# Patient Record
Sex: Female | Born: 2000 | Race: White | Hispanic: No | Marital: Single | Smoking: Current every day smoker
Health system: Southern US, Community
[De-identification: ages and names within clinical notes are randomized; demographics above are authoritative.]

---
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
## 2020-12-09 ENCOUNTER — Encounter (HOSPITAL_BASED_OUTPATIENT_CLINIC_OR_DEPARTMENT_OTHER): Payer: Self-pay

## 2020-12-09 ENCOUNTER — Other Ambulatory Visit: Payer: Self-pay

## 2020-12-09 ENCOUNTER — Emergency Department (HOSPITAL_BASED_OUTPATIENT_CLINIC_OR_DEPARTMENT_OTHER): Payer: Self-pay

## 2020-12-09 ENCOUNTER — Emergency Department (HOSPITAL_BASED_OUTPATIENT_CLINIC_OR_DEPARTMENT_OTHER)
Admission: EM | Admit: 2020-12-09 | Discharge: 2020-12-09 | Disposition: A | Discharge status: Home / Self Care | Payer: Self-pay | Attending: Emergency Medicine | Admitting: Emergency Medicine

## 2020-12-09 DIAGNOSIS — O26891 Other specified pregnancy related conditions, first trimester: Secondary | ICD-10-CM | POA: Insufficient documentation

## 2020-12-09 DIAGNOSIS — R102 Pelvic and perineal pain: Secondary | ICD-10-CM

## 2020-12-09 DIAGNOSIS — Z3A00 Weeks of gestation of pregnancy not specified: Secondary | ICD-10-CM | POA: Insufficient documentation

## 2020-12-09 DIAGNOSIS — O219 Vomiting of pregnancy, unspecified: Secondary | ICD-10-CM | POA: Insufficient documentation

## 2020-12-09 DIAGNOSIS — F17290 Nicotine dependence, other tobacco product, uncomplicated: Secondary | ICD-10-CM | POA: Insufficient documentation

## 2020-12-09 DIAGNOSIS — R1030 Lower abdominal pain, unspecified: Secondary | ICD-10-CM | POA: Insufficient documentation

## 2020-12-09 DIAGNOSIS — O99331 Smoking (tobacco) complicating pregnancy, first trimester: Secondary | ICD-10-CM | POA: Insufficient documentation

## 2020-12-09 LAB — COMPREHENSIVE METABOLIC PANEL
ALT: 16 U/L (ref 0–44)
AST: 19 U/L (ref 15–41)
Albumin: 4.2 g/dL (ref 3.5–5.0)
Alkaline Phosphatase: 47 U/L (ref 38–126)
Anion gap: 11 (ref 5–15)
BUN: 10 mg/dL (ref 6–20)
CO2: 21 mmol/L — ABNORMAL LOW (ref 22–32)
Calcium: 9 mg/dL (ref 8.9–10.3)
Chloride: 104 mmol/L (ref 98–111)
Creatinine, Ser: 0.64 mg/dL (ref 0.44–1.00)
GFR, Estimated: >60 mL/min (ref >60)
Glucose, Bld: 93 mg/dL (ref 70–99)
Potassium: 3.4 mmol/L — ABNORMAL LOW (ref 3.5–5.1)
Sodium: 136 mmol/L (ref 135–145)
Total Bilirubin: 0.5 mg/dL (ref 0.3–1.2)
Total Protein: 7.1 g/dL (ref 6.5–8.1)

## 2020-12-09 LAB — URINALYSIS, MICROSCOPIC (REFLEX)

## 2020-12-09 LAB — CBC WITH DIFFERENTIAL/PLATELET
Abs Immature Granulocytes: 0.05 10*3/uL (ref 0.00–0.07)
Basophils Absolute: 0 10*3/uL (ref 0.0–0.1)
Basophils Relative: 0 %
Eosinophils Absolute: 0.1 10*3/uL (ref 0.0–0.5)
Eosinophils Relative: 1 %
HCT: 39.3 % (ref 36.0–46.0)
Hemoglobin: 13.6 g/dL (ref 12.0–15.0)
Immature Granulocytes: 1 %
Lymphocytes Relative: 22 %
Lymphs Abs: 1.9 10*3/uL (ref 0.7–4.0)
MCH: 30.8 pg (ref 26.0–34.0)
MCHC: 34.6 g/dL (ref 30.0–36.0)
MCV: 88.9 fL (ref 80.0–100.0)
Monocytes Absolute: 0.6 10*3/uL (ref 0.1–1.0)
Monocytes Relative: 7 %
Neutro Abs: 6.2 10*3/uL (ref 1.7–7.7)
Neutrophils Relative %: 69 %
Platelets: 372 10*3/uL (ref 150–400)
RBC: 4.42 MIL/uL (ref 3.87–5.11)
RDW: 12.8 % (ref 11.5–15.5)
WBC: 8.9 10*3/uL (ref 4.0–10.5)
nRBC: 0 % (ref 0.0–0.2)

## 2020-12-09 LAB — URINALYSIS, ROUTINE W REFLEX MICROSCOPIC
Bilirubin Urine: NEGATIVE
Glucose, UA: NEGATIVE mg/dL
Ketones, ur: 15 mg/dL — AB
Nitrite: NEGATIVE
Protein, ur: NEGATIVE mg/dL
Specific Gravity, Urine: >1.03 — ABNORMAL HIGH (ref 1.005–1.030)
pH: 6 (ref 5.0–8.0)

## 2020-12-09 LAB — HCG, QUANTITATIVE, PREGNANCY: hCG, Beta Chain, Quant, S: 1055 m[IU]/mL — ABNORMAL HIGH (ref <5)

## 2020-12-09 LAB — LIPASE, BLOOD: Lipase: 26 U/L (ref 11–51)

## 2020-12-09 LAB — PREGNANCY, URINE: Preg Test, Ur: POSITIVE — AB

## 2020-12-09 MED ORDER — CEPHALEXIN 500 MG PO CAPS: 500.0000 mg | ORAL_CAPSULE | Freq: Two times a day (BID) | ORAL | 0 refills | Status: AC

## 2020-12-09 MED ORDER — SODIUM CHLORIDE 0.9 % IV BOLUS
1000.0000 mL | Freq: Once | INTRAVENOUS | Status: AC
  Administered 2020-12-09: 1000 mL via INTRAVENOUS

## 2020-12-09 NOTE — ED Notes (Signed)
To [place] via stretcher. Pt tolerated well.

## 2020-12-09 NOTE — ED Provider Notes (Signed)
[facility] EMERGENCY DEPARTMENT Provider Note   CSN: [id number] Arrival date & time: 12/09/20  1734     History Chief Complaint  Patient presents with  . Abdominal Pain    Judy Rivera is a 20 y.o. female.  HPI      Judy Rivera is a 20 y.o. female, with a history of G3P2, presenting to the ED with abdominal pain beginning yesterday.  She endorses lower abdominal pain, worse on the right, constant since onset, described as both dull and sharp, currently 5/10, radiating to the lower back. She also notes some lower pelvic pressure with urination.  She has not had similar symptoms in the past.  She did have nausea with some vomiting last night due to the intensity of pain.  She states her pain today is not as intense as it was yesterday. LMP Jan 26, but with irregular menstrual cycles.  Denies fever/chills, syncope, chest pain, shortness of breath, dysuria, difficulty urinating, hematuria, vaginal bleeding, abnormal vaginal discharge, diarrhea, hematochezia/melena, or any other complaints.  History reviewed. No pertinent past medical history.  There are no problems to display for this patient.   History reviewed. No pertinent surgical history.   OB History    Gravida  3   Para  0   Term      Preterm      AB  2   Living        SAB      IAB      Ectopic      Multiple      Live Births              No family history on file.  Social History   Tobacco Use  . Smoking status: Current Every Day Smoker    Types: E-cigarettes  . Smokeless tobacco: Never Used  Vaping Use  . Vaping Use: Every day  Substance Use Topics  . Alcohol use: Never  . Drug use: Yes    Types: Marijuana    Home Medications Prior to Admission medications   Medication Sig Start Date End Date Taking? Authorizing Provider  cephALEXin (KEFLEX) 500 MG capsule Take 1 capsule (500 mg total) by mouth 2 (two) times daily for 3 days. 12/09/20 12/12/20 Yes Jerrel Tiberio C,  PA-C    Allergies    Patient has no known allergies.  Review of Systems   Review of Systems  Constitutional: Negative for chills, diaphoresis and fever.  Respiratory: Negative for shortness of breath.   Cardiovascular: Negative for chest pain.  Gastrointestinal: Positive for abdominal pain, nausea and vomiting. Negative for blood in stool, constipation and diarrhea.  Genitourinary: Negative for difficulty urinating, dysuria, flank pain, hematuria, vaginal bleeding and vaginal discharge.  Neurological: Negative for dizziness, syncope and weakness.  All other systems reviewed and are negative.   Physical Exam Updated Vital Signs BP 109/67 (BP Location: Left Arm)   Pulse 84   Temp 98.7 F (37.1 C) (Oral)   Resp 18   Ht 5' (1.524 m)   Wt 67.6 kg   LMP 10/26/2020   SpO2 98%   BMI 29.10 kg/m   Physical Exam Vitals and nursing note reviewed.  Constitutional:      General: She is not in acute distress.    Appearance: She is well-developed. She is not diaphoretic.  HENT:     Head: Normocephalic and atraumatic.     Mouth/Throat:     Mouth: Mucous membranes are moist.     Pharynx:  Oropharynx is clear.  Eyes:     Conjunctiva/sclera: Conjunctivae normal.  Cardiovascular:     Rate and Rhythm: Normal rate and regular rhythm.     Pulses: Normal pulses.     Heart sounds: Normal heart sounds.  Pulmonary:     Effort: Pulmonary effort is normal. No respiratory distress.     Breath sounds: Normal breath sounds.  Abdominal:     Palpations: Abdomen is soft.     Tenderness: There is abdominal tenderness in the right lower quadrant and suprapubic area. There is no guarding.  Musculoskeletal:     Cervical back: Neck supple.  Skin:    General: Skin is warm and dry.  Neurological:     Mental Status: She is alert.  Psychiatric:        Mood and Affect: Mood and affect normal.        Speech: Speech normal.        Behavior: Behavior normal.     ED Results / Procedures / Treatments    Labs (all labs ordered are listed, but only abnormal results are displayed) Labs Reviewed  URINALYSIS, ROUTINE W REFLEX MICROSCOPIC - Abnormal; Notable for the following components:      Result Value   APPearance CLOUDY (*)    Specific Gravity, Urine >1.030 (*)    Hgb urine dipstick LARGE (*)    Ketones, ur 15 (*)    Leukocytes,Ua TRACE (*)    All other components within normal limits  PREGNANCY, URINE - Abnormal; Notable for the following components:   Preg Test, Ur POSITIVE (*)    All other components within normal limits  URINALYSIS, MICROSCOPIC (REFLEX) - Abnormal; Notable for the following components:   Bacteria, UA MANY (*)    All other components within normal limits  COMPREHENSIVE METABOLIC PANEL - Abnormal; Notable for the following components:   Potassium 3.4 (*)    CO2 21 (*)    All other components within normal limits  HCG, QUANTITATIVE, PREGNANCY - Abnormal; Notable for the following components:   hCG, Beta Chain, Quant, S 1,055 (*)    All other components within normal limits  URINE CULTURE  CBC WITH DIFFERENTIAL/PLATELET  LIPASE, BLOOD    EKG None  Radiology US OB LESS THAN 14 WEEKS WITH OB TRANSVAGINAL  Result Date: 12/09/2020 CLINICAL DATA:  Right lower quadrant pain. EXAM: OBSTETRIC <14 WK [place] AND TRANSVAGINAL OB US TECHNIQUE: Both transabdominal and transvaginal ultrasound examinations were performed for complete evaluation of the gestation as well as the maternal uterus, adnexal regions, and pelvic cul-de-sac. Transvaginal technique was performed to assess early pregnancy. COMPARISON:  None. FINDINGS: Intrauterine gestational sac: None Yolk sac:  Not Visualized. Embryo:  Not Visualized. Cardiac Activity: Not Visualized. Heart Rate: N/A  bpm Subchorionic hemorrhage:  None visualized. Maternal uterus/adnexae: The uterus measures 8.9 cm x 4.3 cm x 6.3 cm and is normal in appearance. The right ovary measures 3.6 cm x 2.0 cm x 2.8 cm. A 1.5 cm x 1.7 cm x 1.5 cm  area of heterogeneous hypoechogenicity is seen within the right ovary. The left ovary 3.0 cm x 2.2 cm x 1.9 cm and is normal in appearance. A small amount of free fluid is seen within the right adnexa and posterior cul-de-sac. IMPRESSION: 1. No evidence of an intrauterine pregnancy. 2. Hypoechoic area within the right ovary which may represent a corpus luteum cyst. Correlation with follow-up pelvic ultrasound and serial beta HCG levels is recommended. Electronically Signed   By: Aram Candela  M.D.   On: 12/09/2020 20:35    Procedures Procedures   Medications Ordered in ED Medications  sodium chloride 0.9 % bolus 1,000 mL (0 mLs Intravenous Stopped 12/09/20 2149)    ED Course  I have reviewed the triage vital signs and the nursing notes.  Pertinent labs & imaging results that were available during my care of the patient were reviewed by me and considered in my medical decision making (see chart for details).    MDM Rules/Calculators/A&P                          Patient presents with abdominal pain beginning yesterday.  Pain has improved since onset. Patient is nontoxic appearing, afebrile, not tachycardic, not tachypneic, not hypotensive, maintains excellent SPO2 on room air, and is in no apparent distress.   I have reviewed the patient's chart to obtain more information.   I reviewed and interpreted the patient's labs and radiological studies. No IUP noted on ultrasound, however, hCG just above 1000. Improved abdominal exam on repeat here in ED. Due to the patient's overall well appearance and spontaneous improvement in her pain, we proposed a plan that included reassessment in 48 hours at the MAU or with return to the ED. Antibiotic initiated due to abnormalities noted on patient's UA, especially in the setting of pregnancy. Strict return precautions discussed.  Patient voices understanding of these instructions, accepts the plan, and is comfortable with discharge.  Findings and  plan of care discussed with attending physician, Susy Frizzle, MD.     Final Clinical Impression(s) / ED Diagnoses Final diagnoses:  Lower abdominal pain    Rx / DC Orders ED Discharge Orders         Ordered    cephALEXin (KEFLEX) 500 MG capsule  2 times daily        12/09/20 2139           Concepcion Living 12/10/20 0029    Pollyann Savoy, MD 12/10/20 [phone]

## 2020-12-09 NOTE — Discharge Instructions (Addendum)
  Your urine appears to be consistent with UTI. Please take all of your antibiotics until finished!   You may develop abdominal discomfort or diarrhea from the antibiotic.  You may help offset this with probiotics which you can buy or get in yogurt. Do not eat or take the probiotics until 2 hours after your antibiotic.   Proceed to the [facility] at [facility] in 48 hours (on March 13) for repeat hCG pregnancy hormone level and repeat abdominal exam.  Return to the emergency department sooner for persistent vomiting, worsening pain, significant vaginal bleeding, passing out, or any other major concerns.

## 2020-12-09 NOTE — ED Triage Notes (Addendum)
Pt c/o lower abd pain n/v started last night-was advised by UC +preg and advised to come to ED-NAD-steady gait

## 2020-12-11 ENCOUNTER — Inpatient Hospital Stay (HOSPITAL_COMMUNITY)
Admission: AD | Admit: 2020-12-11 | Discharge: 2020-12-11 | Disposition: A | Discharge status: Home / Self Care | Payer: Self-pay | Attending: Family Medicine | Admitting: Family Medicine

## 2020-12-11 ENCOUNTER — Encounter (HOSPITAL_COMMUNITY): Payer: Self-pay | Admitting: Family Medicine

## 2020-12-11 ENCOUNTER — Other Ambulatory Visit: Payer: Self-pay

## 2020-12-11 DIAGNOSIS — O3680X0 Pregnancy with inconclusive fetal viability, not applicable or unspecified: Secondary | ICD-10-CM

## 2020-12-11 LAB — URINE CULTURE

## 2020-12-11 LAB — COMPREHENSIVE METABOLIC PANEL
ALT: 16 U/L (ref 0–44)
AST: 17 U/L (ref 15–41)
Albumin: 4 g/dL (ref 3.5–5.0)
Alkaline Phosphatase: 48 U/L (ref 38–126)
Anion gap: 9 (ref 5–15)
BUN: 11 mg/dL (ref 6–20)
CO2: 23 mmol/L (ref 22–32)
Calcium: 9.2 mg/dL (ref 8.9–10.3)
Chloride: 104 mmol/L (ref 98–111)
Creatinine, Ser: 0.64 mg/dL (ref 0.44–1.00)
GFR, Estimated: >60 mL/min (ref >60)
Glucose, Bld: 101 mg/dL — ABNORMAL HIGH (ref 70–99)
Potassium: 4.1 mmol/L (ref 3.5–5.1)
Sodium: 136 mmol/L (ref 135–145)
Total Bilirubin: 0.7 mg/dL (ref 0.3–1.2)
Total Protein: 6.9 g/dL (ref 6.5–8.1)

## 2020-12-11 LAB — ABO/RH: ABO/RH(D): A POS

## 2020-12-11 LAB — HCG, QUANTITATIVE, PREGNANCY: hCG, Beta Chain, Quant, S: 556 m[IU]/mL — ABNORMAL HIGH (ref <5)

## 2020-12-11 NOTE — MAU Provider Note (Addendum)
Event Date/Time  First Provider Initiated Contact with Patient 12/11/20 1459     S Ms. Judy Rivera is a 20 y.o. G3P0020 patient who presents to MAU today for Stat Quant hCG. Patient was evaluated at [facility] ED two days ago for abdominal pain. She states at discharge from that encounter she was told to follow up in MAU for repeat Quant today. She states her abdominal pain resolved yesterday. She continues to experience vaginal spotting. She denies heavy vaginal bleeding, abdominal tenderness, fever or recent illness.  O BP 107/61 (BP Location: Right Arm)   Pulse (!) 105   Temp 98.1 F (36.7 C) (Oral)   Resp 15   LMP 10/26/2020   SpO2 98%    Physical Exam Vitals and nursing note reviewed.  Cardiovascular:     Rate and Rhythm: Normal rate.     Pulses: Normal pulses.  Pulmonary:     Effort: Pulmonary effort is normal.  Abdominal:     General: Abdomen is flat.     Tenderness: There is no abdominal tenderness.  Skin:    Capillary Refill: Capillary refill takes less than 2 seconds.  Neurological:     Mental Status: She is alert and oriented to person, place, and time.  Psychiatric:        Mood and Affect: Mood normal.        Behavior: Behavior normal.        Thought Content: Thought content normal.        Judgment: Judgment normal.    A Medical screening exam complete  No IUP visualized on ultrasound performed 12/09/2020 Quant hCG 1055 on 12/09/2020 Quant hCG 556 today, discussed concern for nonviable pregnancy/miscarriage in progress Blood type A POS  P Discharge from MAU in stable condition with bleeding, ectopic precautions Warning signs for worsening condition that would warrant emergency follow-up discussed Patient may return to MAU as needed for pregnancy  F/U: Patient scheduled for repeat quant hCG at [facility] Tuesday afternoon  Clayton Bibles, [place] 12/11/2020 5:14 PM

## 2020-12-11 NOTE — Discharge Instructions (Signed)
Human Chorionic Gonadotropin Test Why am I having this test? A human chorionic gonadotropin (hCG) test is done to determine whether you are pregnant. It can also be used:  To diagnose an abnormal pregnancy.  To determine whether you have had a miscarriage or are at risk of one. What is being tested? This test checks the level of the human chorionic gonadotropin (hCG) hormone in the blood. This hormone is produced during pregnancy by the cells that form the placenta. The placenta is the organ that grows inside your uterus to nourish a developing baby. When you are pregnant, hCG can be detected in your blood or urine 7 to 8 days before your missed period. The amount of hCG continues to increase for the first 8-10 weeks of pregnancy. The presence of hCG in your blood can be measured with different types of tests. You may have:  A urine test. ? A urine test only shows whether there is hCG in your urine. It does not measure how much.  A qualitative blood test. ? This blood test only shows whether there is hCG in your blood. It does not measure how much.  A quantitative blood test. ? This type of blood test measures the amount of hCG in your blood. ? You may have this test to:  Diagnose an abnormal pregnancy.  Check whether you have had a miscarriage.  Determine whether you are at risk of a miscarriage.  Determine if treatment of an ectopic pregnancy is successful. What kind of sample is taken? Two kinds of samples may be collected to test for the hCG hormone.  Blood. It is usually collected by inserting a needle into a blood vessel.  Urine. It is usually collected by urinating into a germ-free (sterile) specimen cup.      How do I prepare for this test? No preparation is needed for a blood test.  Some preparation is needed for a urine test:  For best results, collect the sample the first time you urinate in the morning. That is when the concentration of hCG is highest.  Do not  drink too much fluid. Drink as you normally would, or as directed by your health care provider. Tell a health care provider about:  All medicines you are taking, including vitamins, herbs, eye drops, creams, and over-the-counter medicines.  Any blood in your urine. This may interfere with the result. How are the results reported? Depending on the type of test that you have, your test results may be reported as values. Your health care provider will compare your results to normal ranges that were established after testing a large group of people (reference ranges). Reference ranges may vary among labs and hospitals. For this test, common reference ranges that show absence of pregnancy are:  Quantitative hCG blood levels: less than 5 IU/L. Other results will be reported as either positive or negative. For this test, normal results (meaning the absence of pregnancy) are:  Negative for hCG in the urine test.  Negative for hCG in the qualitative blood test. What do the results mean? Urine and qualitative blood test  A negative result could mean: ? That you are not pregnant. ? That the test was done too early in your pregnancy to detect hCG in your blood or urine. If you still have other signs of pregnancy, the test will be repeated.  A positive result means: ? That you are most likely pregnant. Your health care provider may confirm your pregnancy with an ultrasound of   your uterus, if needed. Quantitative blood test Results of the quantitative hCG blood test will be reported as values. These values will be interpreted by your health care provider along with your medical history and symptoms you are experiencing. Results outside of expected ranges could mean that:  You are pregnant with twins.  You have abnormal growths in your uterus.  You have an ectopic pregnancy.  You may be experiencing a miscarriage. Talk with your health care provider about what your results mean. Questions to ask  your health care provider Ask your health care provider, or the department that is doing the test:  When will my results be ready?  How will I get my results?  What are my treatment options?  What other tests do I need?  What are my next steps? Summary  A human chorionic gonadotropin (hCG) test is done to determine whether you are pregnant.  When you are pregnant, hCG can be detected in your blood or urine 7 to 8 days before your missed period. HCG levels continue to go up for the first 8-10 weeks of pregnancy.  Your hCG level can be measured with different types of tests. You may have a urine test, a qualitative blood test, or a quantitative blood test.  Talk with your health care provider about what your test results mean. This information is not intended to replace advice given to you by your health care provider. Make sure you discuss any questions you have with your health care provider. Document Revised: 06/20/2020 Document Reviewed: 06/20/2020 Elsevier Patient Education  2021 Elsevier Inc.  

## 2020-12-11 NOTE — MAU Note (Signed)
Pt reports to mau after being seen at [facility] 2 days ago for abd. Pain.  Pt states she was told to follow up in MAU today.  Pt denies any active bleeding or abd pain today.

## 2020-12-13 ENCOUNTER — Telehealth: Payer: Self-pay

## 2020-12-13 ENCOUNTER — Ambulatory Visit: Payer: Self-pay

## 2020-12-13 NOTE — Telephone Encounter (Signed)
Called pt to follow up on missed appt today for repeat beta HCG. Requested pt return call; callback number given. Will attempt to call patient a second time.

## 2020-12-14 NOTE — Telephone Encounter (Signed)
Attempted to call patient. She did not answer. LM for patient to call the office at [phone] to call and reschedule appointment for Beta Hcg.

## 2021-08-10 ENCOUNTER — Telehealth: Payer: Self-pay | Admitting: Physician Assistant

## 2021-08-10 DIAGNOSIS — K625 Hemorrhage of anus and rectum: Secondary | ICD-10-CM

## 2021-08-10 DIAGNOSIS — R1010 Upper abdominal pain, unspecified: Secondary | ICD-10-CM

## 2021-08-10 NOTE — Patient Instructions (Signed)
  [name], thank you for joining Margaretann Loveless, PA-C for today's virtual visit.  While this provider is not your primary care provider (PCP), if your PCP is located in our provider database this encounter information will be shared with them immediately following your visit.  Consent: (Patient) Judy Rivera provided verbal consent for this virtual visit at the beginning of the encounter.  Current Medications: No current outpatient medications on file.   Medications ordered in this encounter:  No orders of the defined types were placed in this encounter.    *If you need refills on other medications prior to your next appointment, please contact your pharmacy*  Follow-Up: Call back or seek an in-person evaluation if the symptoms worsen or if the condition fails to improve as anticipated.  Other Instructions Based on what you shared with me, I feel your condition warrants further evaluation and I recommend that you be seen in a face to face visit.  If you are having a true medical emergency please call 911.      For an urgent face to face visit, [facility] has six urgent care centers for your convenience:     [facility] at [facility] Directions [phone] [address] [place], [place] [address]    [facility]) Get Driving Directions [phone] [address] [place], [place] [address]  [facility]) Get Driving Directions [phone] [address] [place],  [place]  [address]  [facility] at [facility] Get Driving Directions [phone] [address] [place], [place] [address]   [facility] at [facility] Get Driving Directions  [phone] [address] [place], [place] [address]   [facility] at [facility]  Directions [phone] [address], [address] [place], [place] [address]   If you have been instructed to have an in-person evaluation today at a local Urgent Care facility, please use the link below. It will take you to a list of all of our available [facility], including address, phone number and hours of operation. Please do not delay care.  [facility]  If you or a family member do not have a primary care provider, use the link below to schedule a visit and establish care. When you choose a [facility] primary care physician or advanced practice provider, you gain a long-term partner in health. Find a Primary Care Provider  Learn more about [facility]'s in-office and virtual care options: [place] - Get Care Now

## 2021-08-10 NOTE — Progress Notes (Signed)
Virtual Visit Consent   Judy Rivera, you are scheduled for a virtual visit with a [facility] provider today.     Just as with appointments in the office, your consent must be obtained to participate.  Your consent will be active for this visit and any virtual visit you may have with one of our providers in the next 365 days.     If you have a MyChart account, a copy of this consent can be sent to you electronically.  All virtual visits are billed to your insurance company just like a traditional visit in the office.    As this is a virtual visit, video technology does not allow for your provider to perform a traditional examination.  This may limit your provider's ability to fully assess your condition.  If your provider identifies any concerns that need to be evaluated in person or the need to arrange testing (such as labs, EKG, etc.), we will make arrangements to do so.     Although advances in technology are sophisticated, we cannot ensure that it will always work on either your end or our end.  If the connection with a video visit is poor, the visit may have to be switched to a telephone visit.  With either a video or telephone visit, we are not always able to ensure that we have a secure connection.     I need to obtain your verbal consent now.   Are you willing to proceed with your visit today?    Judy Rivera has provided verbal consent on 08/10/2021 for a virtual visit (video or telephone).   Margaretann Loveless, PA-C   Date: 08/10/2021 11:18 AM   Virtual Visit via Video Note   I, Margaretann Loveless, connected with  Judy Rivera  ([id number], [birth year]) on 08/10/21 at 11:15 AM EST by a video-enabled telemedicine application and verified that I am speaking with the correct person using two identifiers.  Location: Patient: Virtual Visit Location Patient: Home Provider: Virtual Visit Location Provider: Home Office   I discussed the limitations of evaluation and  management by telemedicine and the availability of in person appointments. The patient expressed understanding and agreed to proceed.    History of Present Illness: Judy Rivera is a 20 y.o. who identifies as a female who was assigned female at birth, and is being seen today for rectal bleeding and stomach pain. Bleeding has been ongoing for 3 days. There is no pain. Bleeding is occurring with or without BM. With BM bleeding increases. Having sharp stabbing pains in the upper stomach and in the RUQ. Reports digestive issues for a while. Gets bloated after eating.   Problems: There are no problems to display for this patient.   Allergies: No Known Allergies Medications: No current outpatient medications on file.  Observations/Objective: Patient is well-developed, well-nourished in no acute distress.  Resting comfortably at home.  Head is normocephalic, atraumatic.  No labored breathing.  Speech is clear and coherent with logical content.  Patient is alert and oriented at baseline.    Assessment and Plan: 1. Rectal bleeding  2. Pain of upper abdomen  - Due to abdominal pain with rectal bleeding patient was advised to seek further care at local UC for further evaluation  Follow Up Instructions: I discussed the assessment and treatment plan with the patient. The patient was provided an opportunity to ask questions and all were answered. The patient agreed with the plan and demonstrated an understanding of the instructions.  A copy of instructions were sent to the patient via MyChart unless otherwise noted below.    The patient was advised to call back or seek an in-person evaluation if the symptoms worsen or if the condition fails to improve as anticipated.  Time:  I spent 10 minutes with the patient via telehealth technology discussing the above problems/concerns.    Margaretann Loveless, PA-C

## 2021-08-11 ENCOUNTER — Encounter (HOSPITAL_COMMUNITY): Payer: Self-pay | Admitting: Emergency Medicine

## 2021-08-11 ENCOUNTER — Emergency Department (HOSPITAL_COMMUNITY)
Admission: EM | Admit: 2021-08-11 | Discharge: 2021-08-11 | Disposition: A | Discharge status: Home / Self Care | Payer: Medicaid Other | Attending: Emergency Medicine | Admitting: Emergency Medicine

## 2021-08-11 ENCOUNTER — Other Ambulatory Visit: Payer: Self-pay

## 2021-08-11 DIAGNOSIS — R112 Nausea with vomiting, unspecified: Secondary | ICD-10-CM | POA: Insufficient documentation

## 2021-08-11 DIAGNOSIS — J029 Acute pharyngitis, unspecified: Secondary | ICD-10-CM | POA: Insufficient documentation

## 2021-08-11 DIAGNOSIS — R1010 Upper abdominal pain, unspecified: Secondary | ICD-10-CM | POA: Insufficient documentation

## 2021-08-11 DIAGNOSIS — K625 Hemorrhage of anus and rectum: Secondary | ICD-10-CM | POA: Insufficient documentation

## 2021-08-11 DIAGNOSIS — F17290 Nicotine dependence, other tobacco product, uncomplicated: Secondary | ICD-10-CM | POA: Insufficient documentation

## 2021-08-11 LAB — BASIC METABOLIC PANEL
Anion gap: 7 (ref 5–15)
BUN: 11 mg/dL (ref 6–20)
CO2: 26 mmol/L (ref 22–32)
Calcium: 9.3 mg/dL (ref 8.9–10.3)
Chloride: 105 mmol/L (ref 98–111)
Creatinine, Ser: 0.85 mg/dL (ref 0.44–1.00)
GFR, Estimated: >60 mL/min (ref >60)
Glucose, Bld: 102 mg/dL — ABNORMAL HIGH (ref 70–99)
Potassium: 4.2 mmol/L (ref 3.5–5.1)
Sodium: 138 mmol/L (ref 135–145)

## 2021-08-11 LAB — CBC WITH DIFFERENTIAL/PLATELET
Abs Immature Granulocytes: 0.02 10*3/uL (ref 0.00–0.07)
Basophils Absolute: 0 10*3/uL (ref 0.0–0.1)
Basophils Relative: 1 %
Eosinophils Absolute: 0.2 10*3/uL (ref 0.0–0.5)
Eosinophils Relative: 3 %
HCT: 42.5 % (ref 36.0–46.0)
Hemoglobin: 14.6 g/dL (ref 12.0–15.0)
Immature Granulocytes: 0 %
Lymphocytes Relative: 45 %
Lymphs Abs: 2.5 10*3/uL (ref 0.7–4.0)
MCH: 30.4 pg (ref 26.0–34.0)
MCHC: 34.4 g/dL (ref 30.0–36.0)
MCV: 88.5 fL (ref 80.0–100.0)
Monocytes Absolute: 0.4 10*3/uL (ref 0.1–1.0)
Monocytes Relative: 7 %
Neutro Abs: 2.4 10*3/uL (ref 1.7–7.7)
Neutrophils Relative %: 44 %
Platelets: 335 10*3/uL (ref 150–400)
RBC: 4.8 MIL/uL (ref 3.87–5.11)
RDW: 12.5 % (ref 11.5–15.5)
WBC: 5.6 10*3/uL (ref 4.0–10.5)
nRBC: 0 % (ref 0.0–0.2)

## 2021-08-11 LAB — I-STAT BETA HCG BLOOD, ED (MC, WL, AP ONLY): I-stat hCG, quantitative: <5 m[IU]/mL (ref <5)

## 2021-08-11 NOTE — ED Provider Notes (Signed)
[facility]-EMERGENCY DEPT Provider Note   CSN: [id number] Arrival date & time: 08/11/21  1914     History Chief Complaint  Patient presents with   Rectal Bleeding    Judy Rivera is a 20 y.o. female.  She has not Stefanon past medical history.  She is complaining of on and off blood in her stool for the last 4 days.  She said her stools formed and brown but has some red blood that comes off it into the bowl.  Generally painless although has noticed a little bit of upper abdominal cramping and left-sided cramping.  She denies any vaginal bleeding or urinary bleeding.  She was nauseous and vomited once although she thinks it was due to her recent sore throat.  No fevers or chills.  Does not take any medications.  Denies any rectal intercourse  The history is provided by the patient.  Rectal Bleeding Quality:  Bright red Amount:  Scant Duration:  4 days Timing:  Intermittent Chronicity:  New Context: defecation   Relieved by:  None tried Worsened by:  Defecation Ineffective treatments:  None tried Associated symptoms: abdominal pain, recent illness and vomiting   Associated symptoms: no dizziness, no epistaxis, no fever and no hematemesis   Risk factors: no anticoagulant use and no NSAID use       History reviewed. No pertinent past medical history.  There are no problems to display for this patient.   History reviewed. No pertinent surgical history.   OB History     Gravida  3   Para  0   Term      Preterm      AB  2   Living         SAB      IAB      Ectopic      Multiple      Live Births              No family history on file.  Social History   Tobacco Use   Smoking status: Every Day    Types: E-cigarettes   Smokeless tobacco: Never  Vaping Use   Vaping Use: Every day  Substance Use Topics   Alcohol use: Never   Drug use: Yes    Types: Marijuana    Home Medications Prior to Admission medications   Not  on File    Allergies    Patient has no known allergies.  Review of Systems   Review of Systems  Constitutional:  Negative for fever.  HENT:  Negative for nosebleeds and sore throat.   Eyes:  Negative for visual disturbance.  Respiratory:  Negative for shortness of breath.   Cardiovascular:  Negative for chest pain.  Gastrointestinal:  Positive for abdominal pain, blood in stool, hematochezia and vomiting. Negative for hematemesis.  Genitourinary:  Negative for dysuria.  Musculoskeletal:  Negative for back pain.  Skin:  Negative for rash.  Neurological:  Negative for dizziness.   Physical Exam Updated Vital Signs BP (!) 127/95 (BP Location: Right Arm)   Pulse 88   Temp 98.6 F (37 C) (Oral)   Resp 18   Ht 5' (1.524 m)   Wt 75.8 kg   LMP 07/19/2021 (Approximate)   SpO2 99%   Breastfeeding Unknown   BMI 32.61 kg/m   Physical Exam Vitals and nursing note reviewed.  Constitutional:      General: She is not in acute distress.    Appearance: Normal appearance. She  is well-developed.  HENT:     Head: Normocephalic and atraumatic.  Eyes:     Conjunctiva/sclera: Conjunctivae normal.  Cardiovascular:     Rate and Rhythm: Normal rate and regular rhythm.     Heart sounds: No murmur heard. Pulmonary:     Effort: Pulmonary effort is normal. No respiratory distress.     Breath sounds: Normal breath sounds.  Abdominal:     Palpations: Abdomen is soft.     Tenderness: There is no abdominal tenderness. There is no guarding or rebound.  Musculoskeletal:        General: No deformity or signs of injury. Normal range of motion.     Cervical back: Neck supple.  Skin:    General: Skin is warm and dry.  Neurological:     General: No focal deficit present.     Mental Status: She is alert.    ED Results / Procedures / Treatments   Labs (all labs ordered are listed, but only abnormal results are displayed) Labs Reviewed  BASIC METABOLIC PANEL - Abnormal; Notable for the following  components:      Result Value   Glucose, Bld 102 (*)    All other components within normal limits  CBC WITH DIFFERENTIAL/PLATELET  I-STAT BETA HCG BLOOD, ED ([facility], [facility], [facility] ONLY)    EKG None  Radiology No results found.  Procedures Procedures   Medications Ordered in ED Medications - No data to display  ED Course  I have reviewed the triage vital signs and the nursing notes.  Pertinent labs & imaging results that were available during my care of the patient were reviewed by me and considered in my medical decision making (see chart for details).  Clinical Course as of 08/11/21 5784  [facility] Aug 11, 2021  6962 Rectal exam done with nurse Norberta Keens as chaperone.  No external lesions.  Normal tone no masses.  No stool in vault. [MB]    Clinical Course User Index [MB] Terrilee Files, MD   MDM Rules/Calculators/A&P                          This patient complains of rectal bleeding with defecation; this involves an extensive number of treatment Options and is a complaint that carries with it a high risk of complications and Morbidity. The differential includes hemorrhoids, AVMs, diverticula, anal fissure  I ordered, reviewed and interpreted labs, which included CBC with normal white count normal hemoglobin, chemistries normal with normal BUN and creatinine, pregnancy negative Previous records obtained and reviewed in epic no recent visits  After the interventions stated above, I reevaluated the patient and found patient to be hemodynamically stable and asymptomatic.  Reviewed natural progression of her symptoms and return instructions discussed.   Final Clinical Impression(s) / ED Diagnoses Final diagnoses:  Rectal bleeding    Rx / DC Orders ED Discharge Orders     None        Terrilee Files, MD 08/11/21 [phone]

## 2021-08-11 NOTE — Discharge Instructions (Signed)
You were seen in the emergency department for evaluation of rectal bleeding.  You had blood work done that did not show any significant abnormalities.  Your rectal exam did not show any masses or obvious source of bleeding.  This is possibly hemorrhoids and usually will settle down over time.  Return to emergency department if any high fevers or worsening symptoms.

## 2021-08-11 NOTE — ED Triage Notes (Signed)
Patient complains of BRB is stool the past 4 days, states it is like a light/medium period, pt could urinate and there is blood. Reports a sharp pain on her R or l side, it changes, denies current pain.

## 2022-11-06 IMAGING — US US OB < 14 WEEKS - US OB TV
1 series · 13 of 28 positions shown · non-contrast
Comparison: None.

CLINICAL DATA: Right lower quadrant pain.

EXAM:
OBSTETRIC <14 WK US AND TRANSVAGINAL OB US
TECHNIQUE: Both transabdominal and transvaginal ultrasound examinations were
performed for complete evaluation of the gestation as well as the
maternal uterus, adnexal regions, and pelvic cul-de-sac.
Transvaginal technique was performed to assess early pregnancy.

[Series 1: us ob < 14 weeks - us ob tv · 13 of 61 slices shown]
[im 3/61]
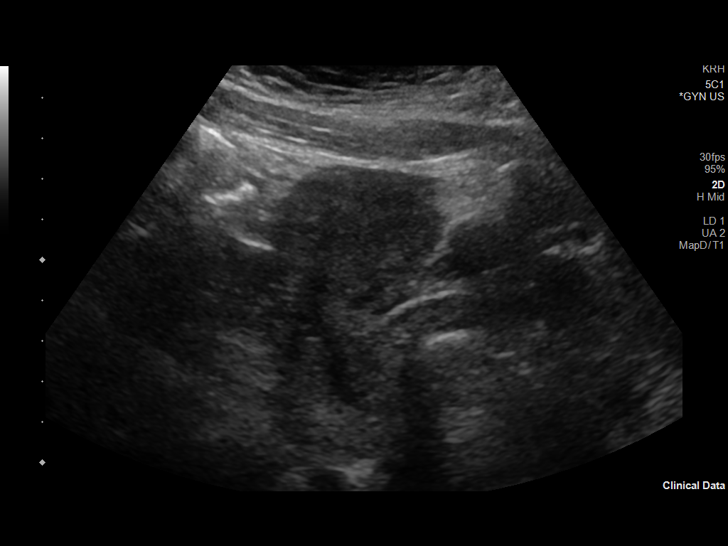
[im 7/61]
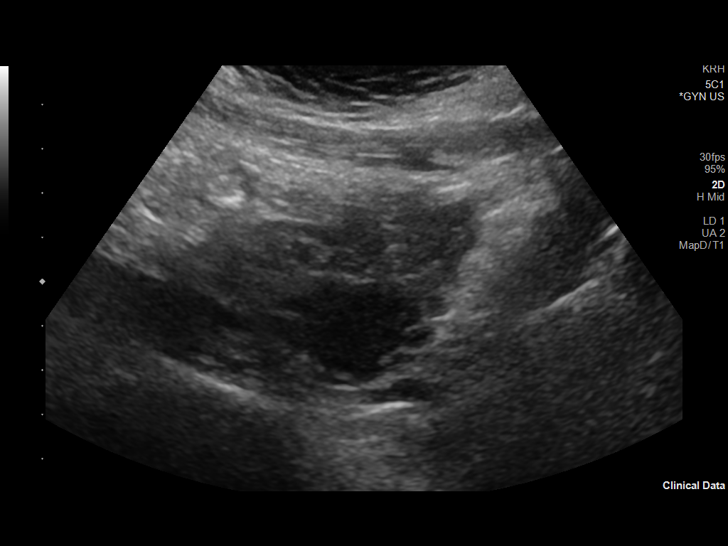
[im 12/61]
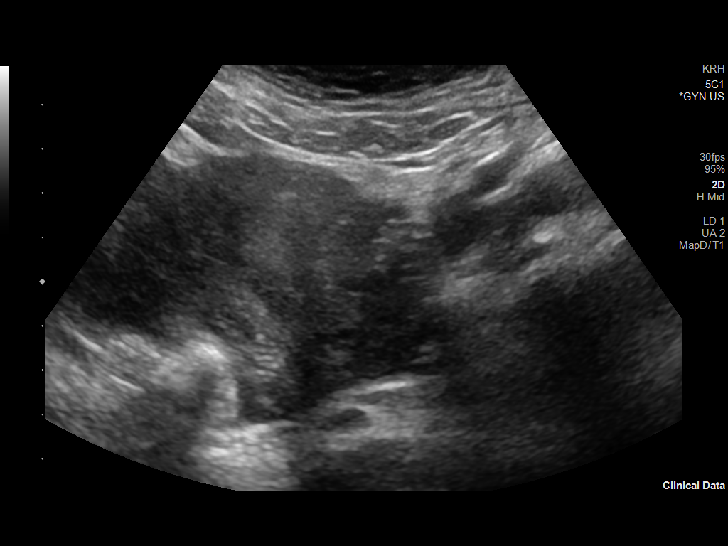
[im 16/61]
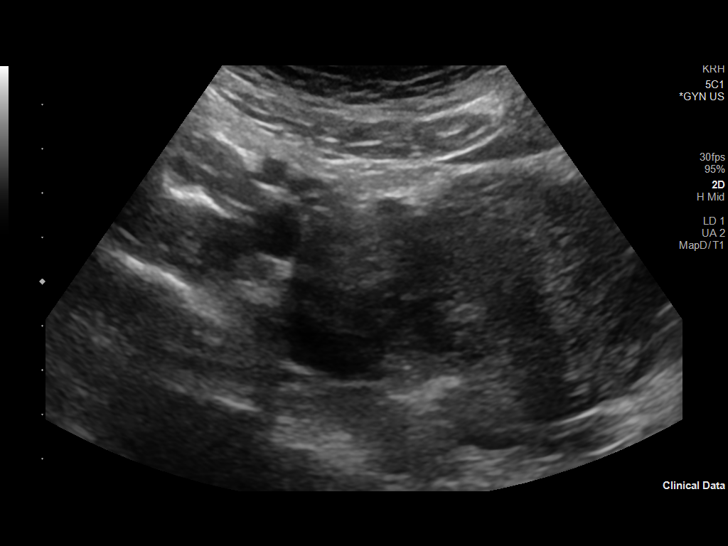
[im 21/61]
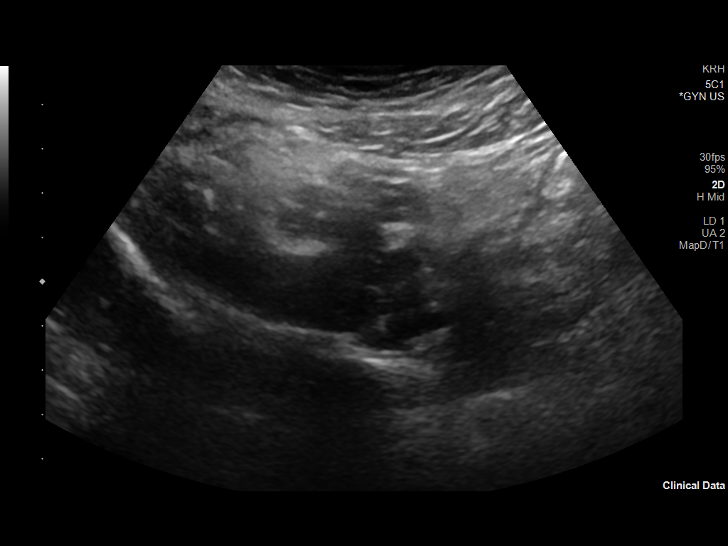
[im 25/61]
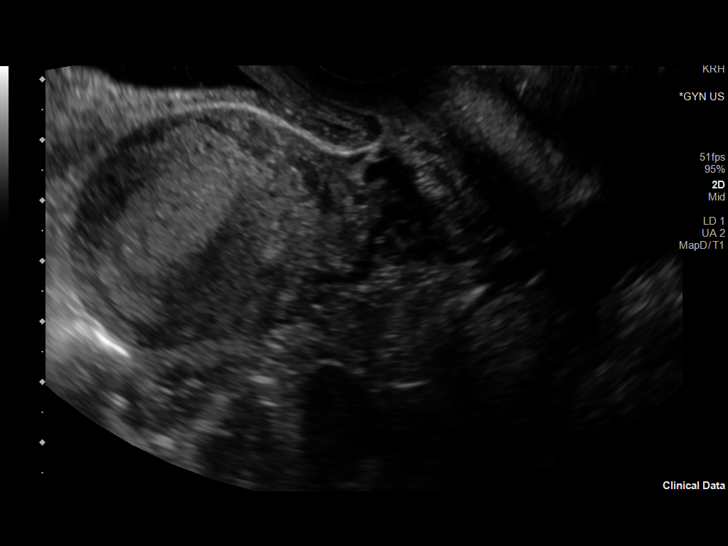
[im 32/61]
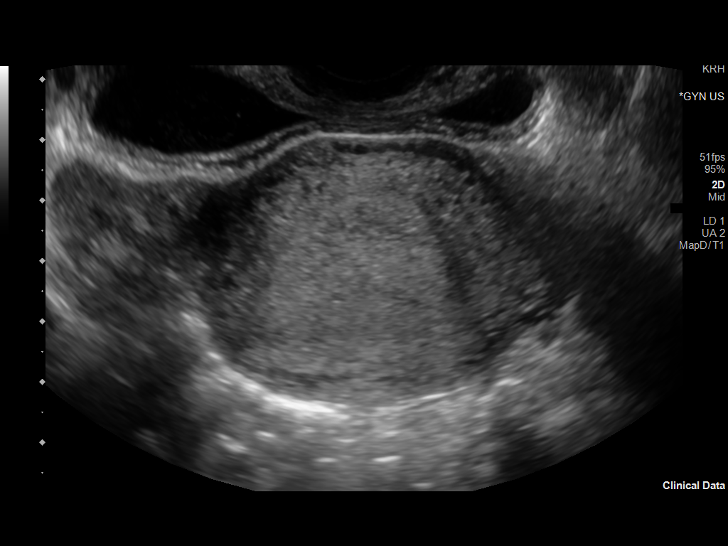
[im 36/61]
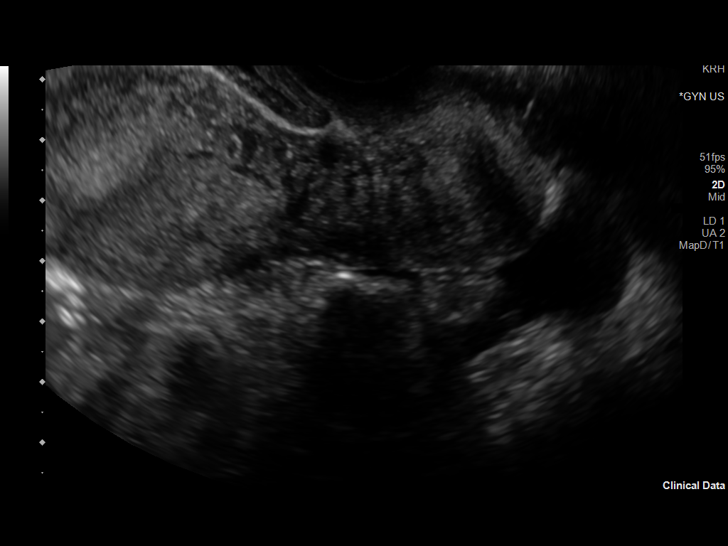
[im 41/61]
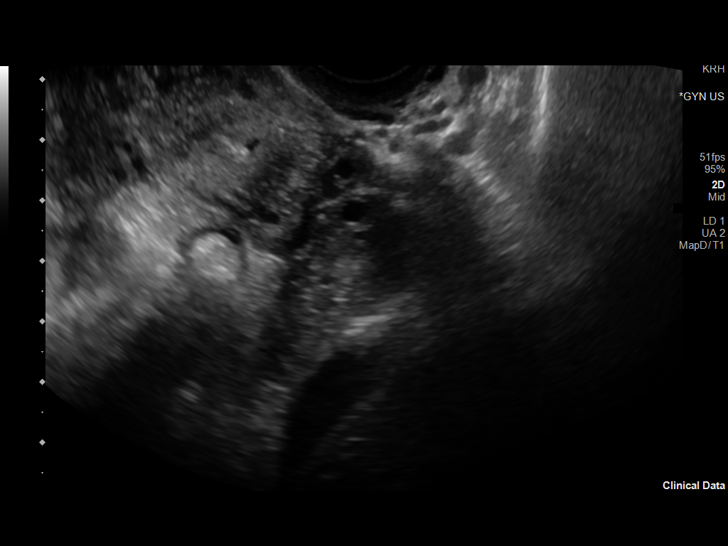
[im 45/61]
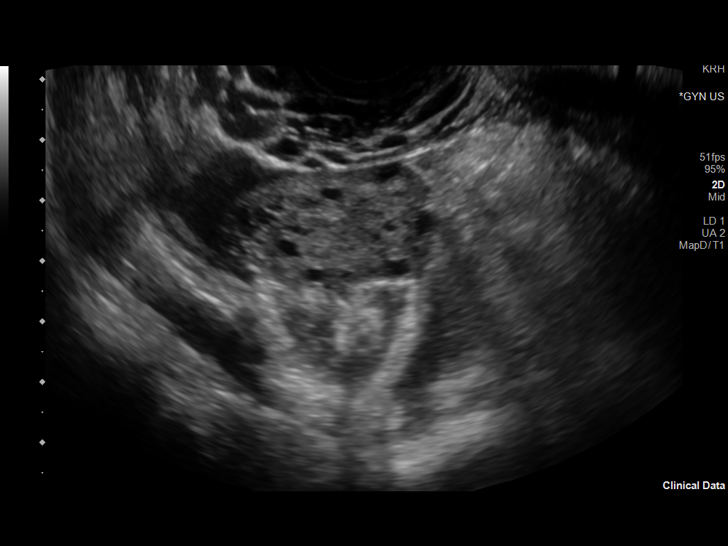
[im 49/61]
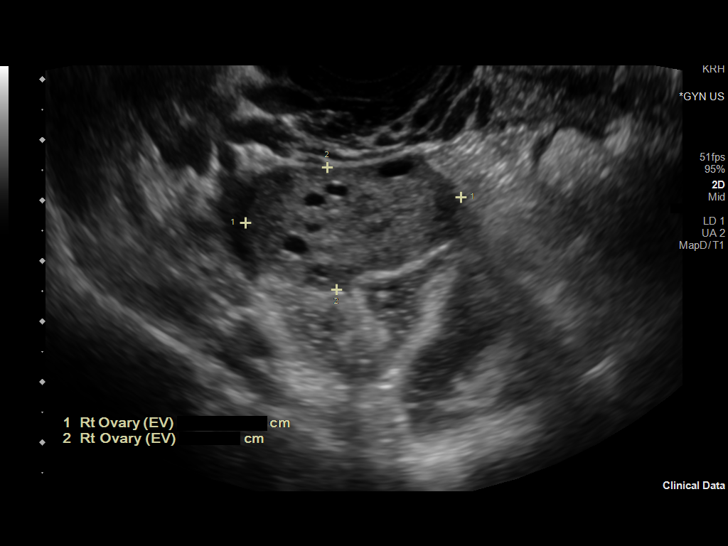
[im 54/61]
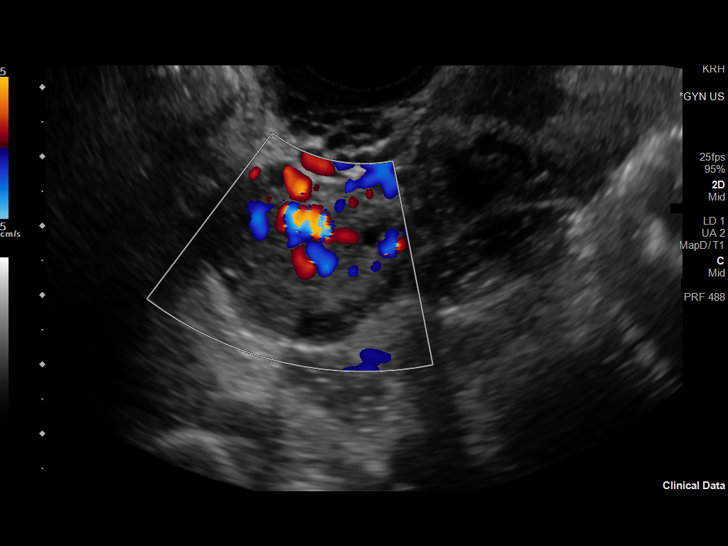
[im 58/61]
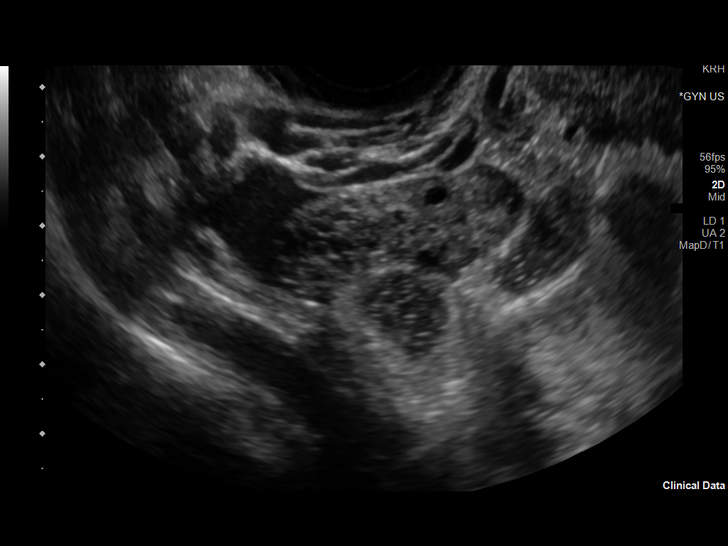

[13 of 28 positions shown; findings below may reference images not displayed]

FINDINGS: Intrauterine gestational sac: None

Yolk sac:  Not Visualized.

Embryo:  Not Visualized.

Cardiac Activity: Not Visualized.

Heart Rate: N/A  bpm

Subchorionic hemorrhage:  None visualized.

Maternal uterus/adnexae: The uterus measures 8.9 cm x 4.3 cm x
cm and is normal in appearance.

The right ovary measures 3.6 cm x 2.0 cm x 2.8 cm. A 1.5 cm x 1.7 cm
x 1.5 cm area of heterogeneous hypoechogenicity is seen within the
right ovary.

The left ovary 3.0 cm x 2.2 cm x 1.9 cm and is normal in appearance.

A small amount of free fluid is seen within the right adnexa and
posterior cul-de-sac.
IMPRESSION: 1. No evidence of an intrauterine pregnancy.
2. Hypoechoic area within the right ovary which may represent a
corpus luteum cyst. Correlation with follow-up pelvic ultrasound and
serial beta HCG levels is recommended.
